# Patient Record
Sex: Male | Born: 1980 | Race: White | Hispanic: Yes | Marital: Married
Health system: Southern US, Community
[De-identification: ages and names within clinical notes are randomized; demographics above are authoritative.]

---

## 2004-10-01 ENCOUNTER — Emergency Department (HOSPITAL_COMMUNITY): Admission: EM | Admit: 2004-10-01 | Discharge: 2004-10-01 | Payer: Self-pay | Admitting: Emergency Medicine

## 2005-08-11 ENCOUNTER — Emergency Department (HOSPITAL_COMMUNITY): Admission: EM | Admit: 2005-08-11 | Discharge: 2005-08-11 | Payer: Self-pay | Admitting: Emergency Medicine

## 2005-08-16 ENCOUNTER — Emergency Department (HOSPITAL_COMMUNITY): Admission: EM | Admit: 2005-08-16 | Discharge: 2005-08-16 | Payer: Self-pay | Admitting: Emergency Medicine

## 2007-09-30 ENCOUNTER — Emergency Department (HOSPITAL_COMMUNITY): Admission: EM | Admit: 2007-09-30 | Discharge: 2007-09-30 | Payer: Self-pay | Admitting: Emergency Medicine

## 2010-11-20 LAB — URINALYSIS, ROUTINE W REFLEX MICROSCOPIC
Hgb urine dipstick: NEGATIVE
Specific Gravity, Urine: 1.027
Urobilinogen, UA: 0.2
pH: 5.5

## 2015-07-13 ENCOUNTER — Encounter (HOSPITAL_COMMUNITY): Payer: Self-pay | Admitting: Emergency Medicine

## 2015-07-13 ENCOUNTER — Emergency Department (HOSPITAL_COMMUNITY)
Admission: EM | Admit: 2015-07-13 | Discharge: 2015-07-13 | Disposition: A | Discharge status: Home / Self Care | Payer: Self-pay | Attending: Emergency Medicine | Admitting: Emergency Medicine

## 2015-07-13 ENCOUNTER — Emergency Department (HOSPITAL_COMMUNITY): Payer: Self-pay

## 2015-07-13 DIAGNOSIS — F419 Anxiety disorder, unspecified: Secondary | ICD-10-CM | POA: Insufficient documentation

## 2015-07-13 DIAGNOSIS — R0602 Shortness of breath: Secondary | ICD-10-CM | POA: Insufficient documentation

## 2015-07-13 DIAGNOSIS — F10120 Alcohol abuse with intoxication, uncomplicated: Secondary | ICD-10-CM | POA: Insufficient documentation

## 2015-07-13 LAB — BASIC METABOLIC PANEL
Anion gap: 10 (ref 5–15)
BUN: 5 mg/dL — AB (ref 6–20)
CALCIUM: 9.3 mg/dL (ref 8.9–10.3)
CO2: 26 mmol/L (ref 22–32)
CREATININE: 0.77 mg/dL (ref 0.61–1.24)
Chloride: 107 mmol/L (ref 101–111)
GFR calc Af Amer: >60 mL/min (ref >60)
Glucose, Bld: 113 mg/dL — ABNORMAL HIGH (ref 65–99)
Potassium: 3.8 mmol/L (ref 3.5–5.1)
SODIUM: 143 mmol/L (ref 135–145)

## 2015-07-13 LAB — I-STAT TROPONIN, ED: Troponin i, poc: 0 ng/mL (ref 0.00–0.08)

## 2015-07-13 LAB — CBC
HCT: 45.5 % (ref 39.0–52.0)
Hemoglobin: 15.6 g/dL (ref 13.0–17.0)
MCH: 29.6 pg (ref 26.0–34.0)
MCHC: 34.3 g/dL (ref 30.0–36.0)
MCV: 86.3 fL (ref 78.0–100.0)
PLATELETS: 240 10*3/uL (ref 150–400)
RBC: 5.27 MIL/uL (ref 4.22–5.81)
RDW: 13.2 % (ref 11.5–15.5)
WBC: 9.7 10*3/uL (ref 4.0–10.5)

## 2015-07-13 LAB — ETHANOL: ALCOHOL ETHYL (B): 199 mg/dL — AB (ref <5)
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
## 2015-07-13 NOTE — ED Notes (Signed)
Report received from Lilibeth RN.  
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
## 2015-07-13 NOTE — Discharge Instructions (Signed)
There does not appear to be an emergent cause for your symptoms at this time. Your exam, labs, EKG and chest x-ray are all reassuring. Please follow-up with your doctor as needed. Return to ED for any new or worsening symptoms.

## 2017-04-28 IMAGING — CR DG CHEST 2V
2 series · 2 of 2 positions shown · non-contrast
Comparison: None.

CLINICAL DATA: Chest pain

EXAM:
CHEST  2 VIEW

[w chest pa]
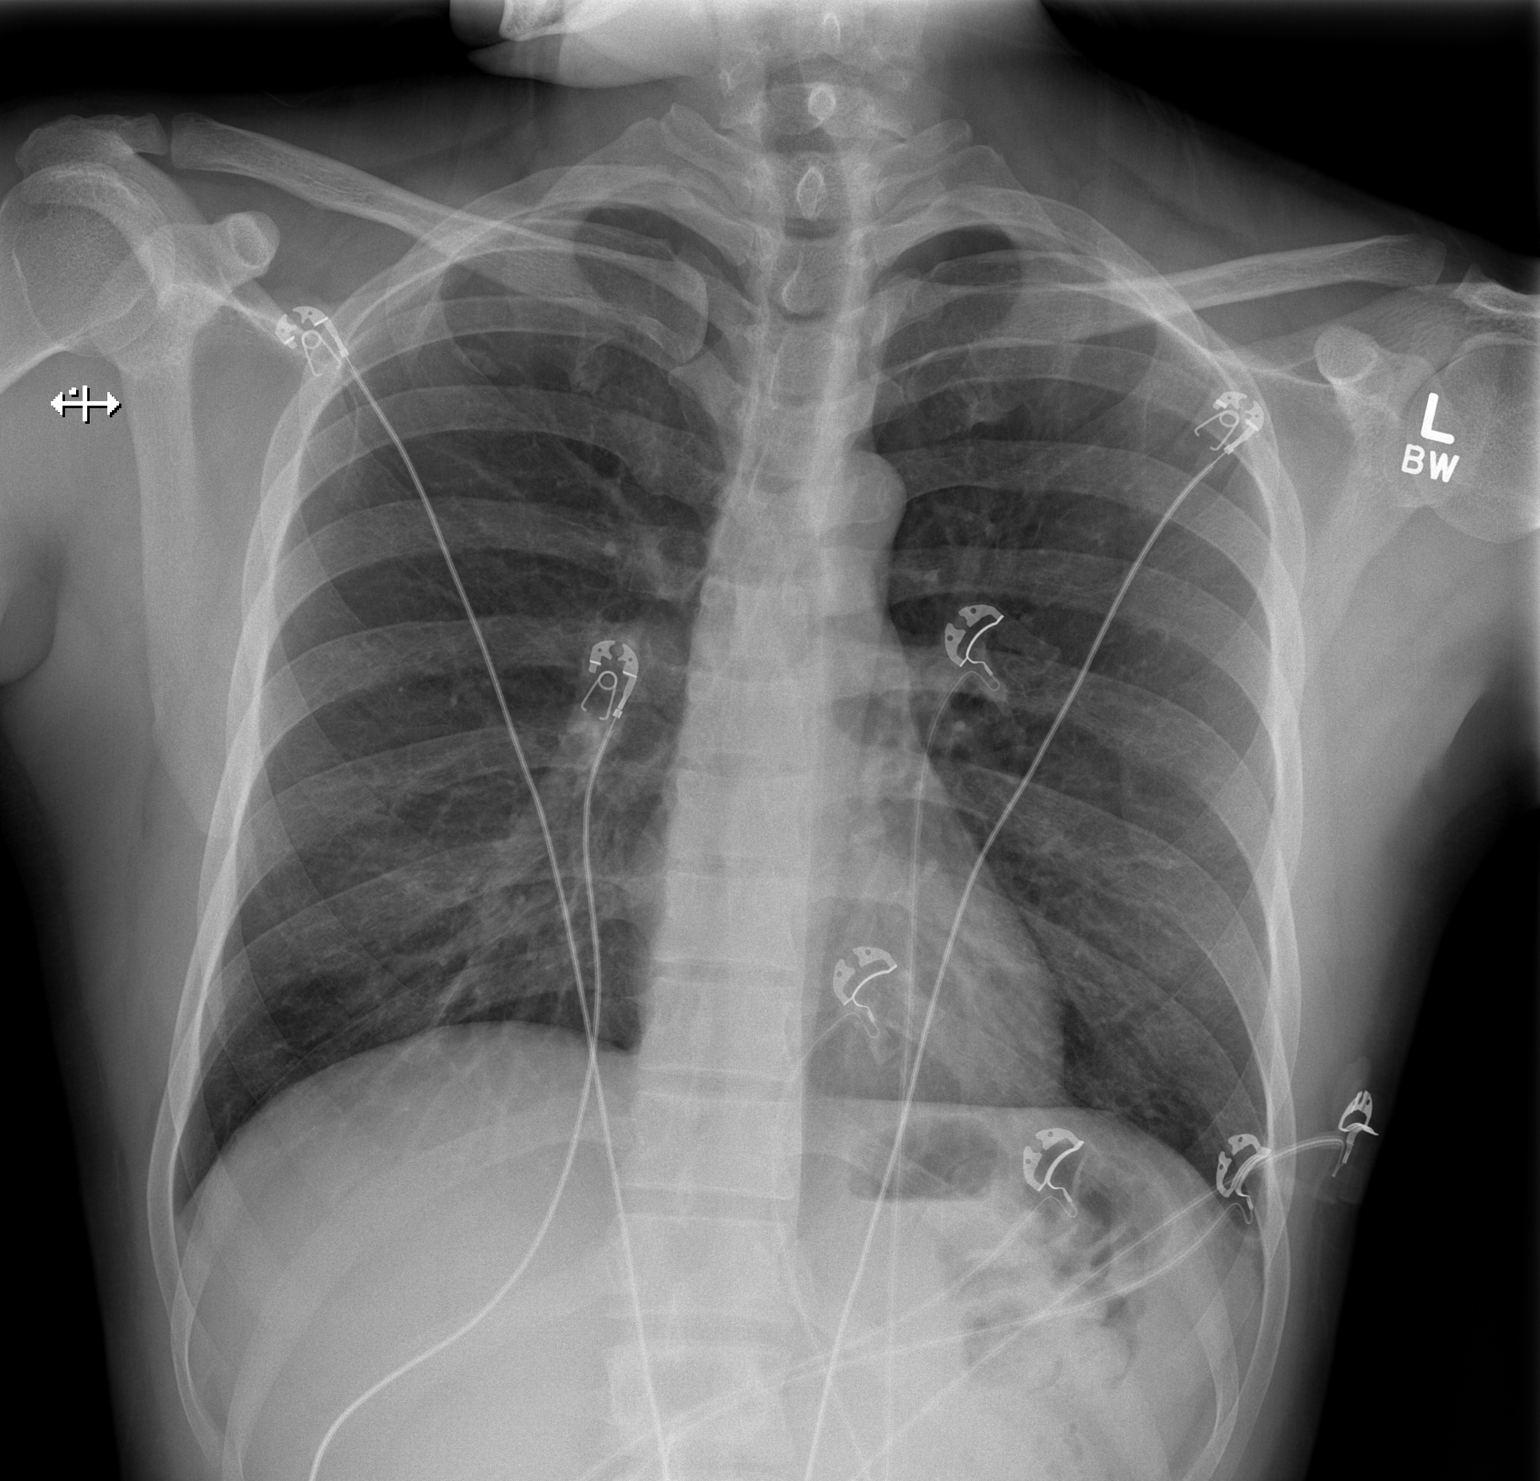

[w chest lat]
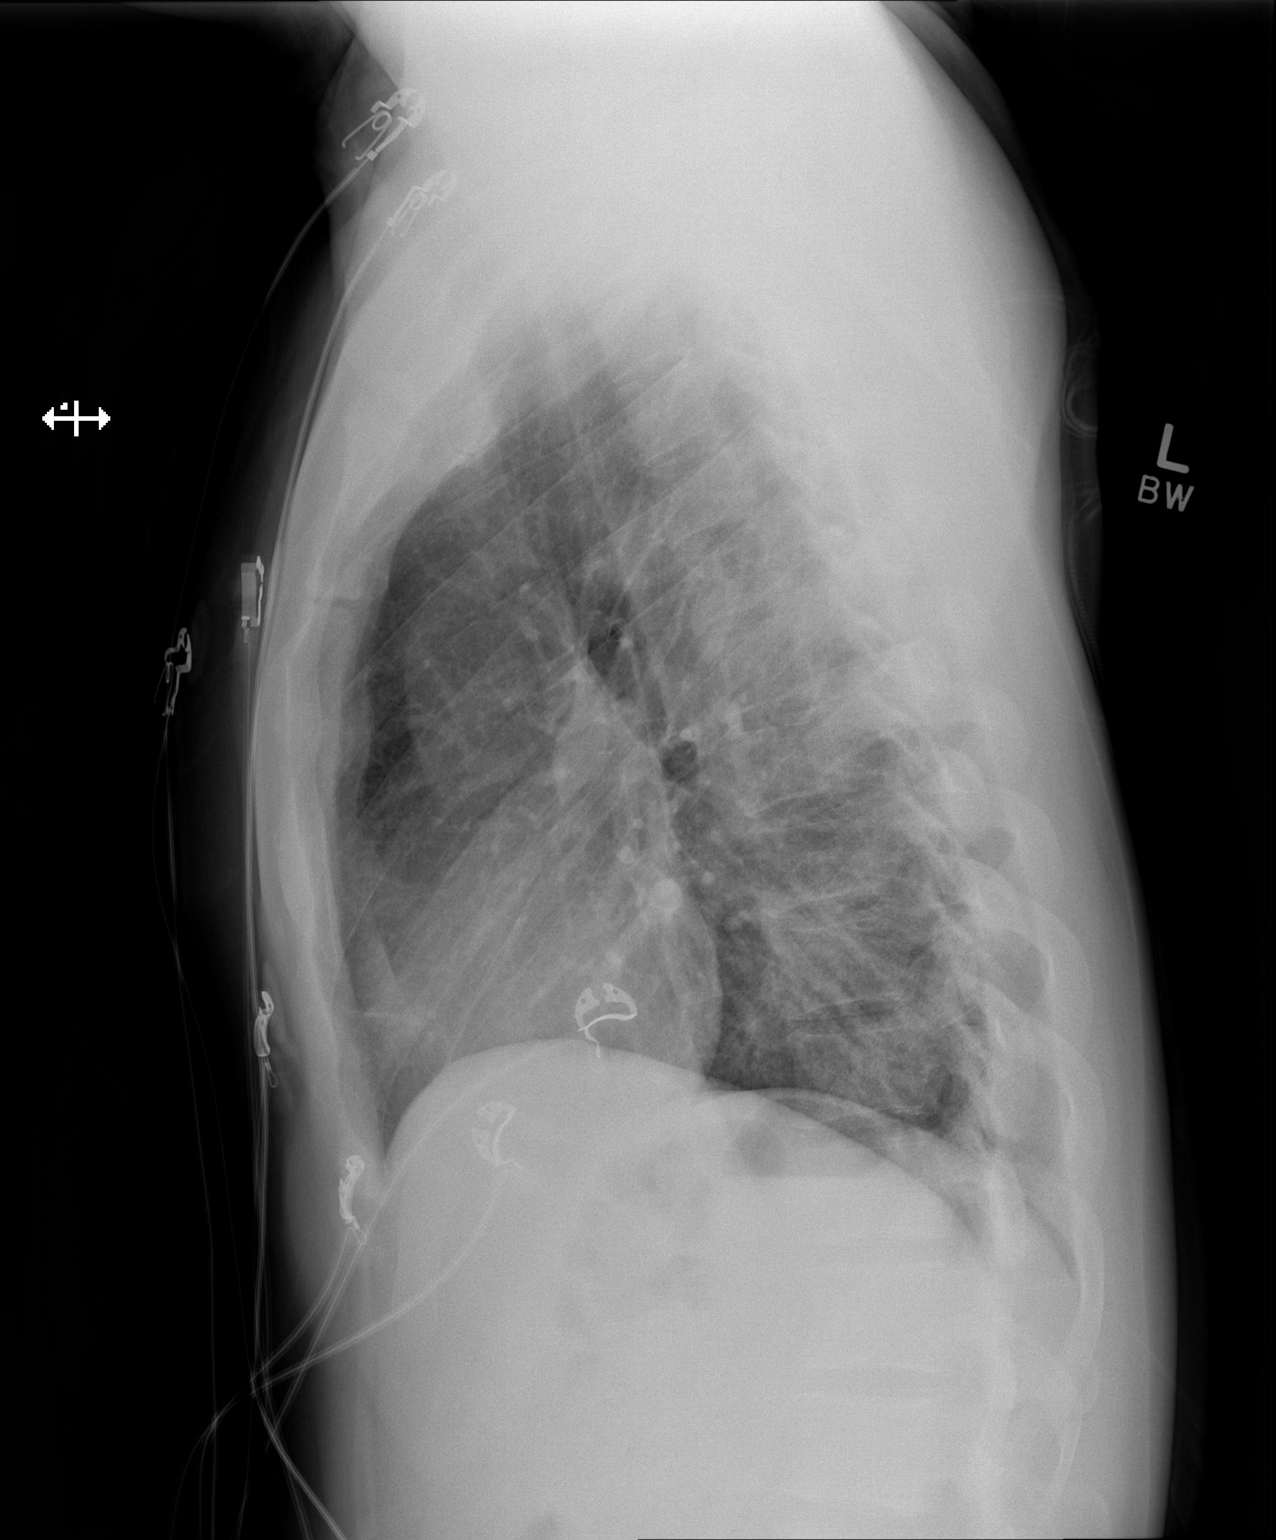

[2 of 2 positions shown; findings below may reference images not displayed]

FINDINGS: Normal heart size. Normal mediastinal contour. No pneumothorax. No
pleural effusion. Lungs appear clear, with no acute consolidative
airspace disease and no pulmonary edema.
IMPRESSION: No active cardiopulmonary disease.
